# Patient Record
Sex: Female | Born: 2000 | Race: White | Hispanic: No | Marital: Single | Smoking: Never smoker
Health system: Southern US, Community
[De-identification: ages and names within clinical notes are randomized; demographics above are authoritative.]

## PROBLEM LIST (undated history)

## (undated) DIAGNOSIS — F329 Major depressive disorder, single episode, unspecified: Secondary | ICD-10-CM

## (undated) DIAGNOSIS — E282 Polycystic ovarian syndrome: Secondary | ICD-10-CM

## (undated) DIAGNOSIS — F419 Anxiety disorder, unspecified: Secondary | ICD-10-CM

## (undated) DIAGNOSIS — F32A Depression, unspecified: Secondary | ICD-10-CM

## (undated) DIAGNOSIS — T7840XA Allergy, unspecified, initial encounter: Secondary | ICD-10-CM

---
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
## 2021-01-10 IMAGING — CT CT NECK W/ CM
4 of 5 series · 14 of 33 positions shown, 16 images · IV contrast (Omni 300)
Comparison: None.

CLINICAL DATA: Chronic neck pain with difficulty swallowing

EXAM:
CT NECK WITH CONTRAST
TECHNIQUE: Multidetector CT imaging of the neck was performed using the
standard protocol following the bolus administration of intravenous
contrast.
CONTRAST:  75mL OMNIPAQUE IOHEXOL 300 MG/ML  SOLN

[Series 3: neck 2.0 st · axial · 0.43mm/px · z∈[-313,-185]mm · 3 of 128 slices shown, 4 images]
[im 32/128  soft-tissue]
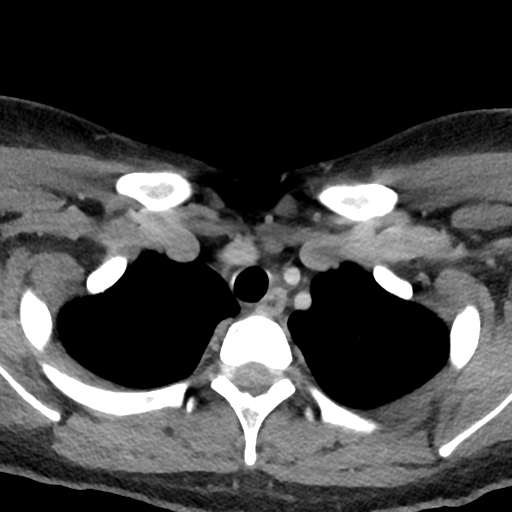
[im 32/128  bone]
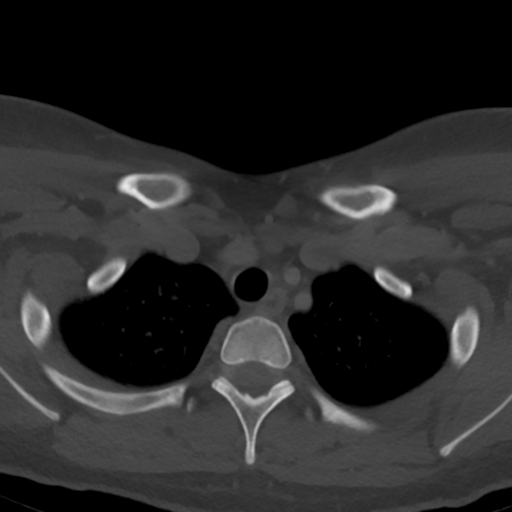
[im 64/128  bone]
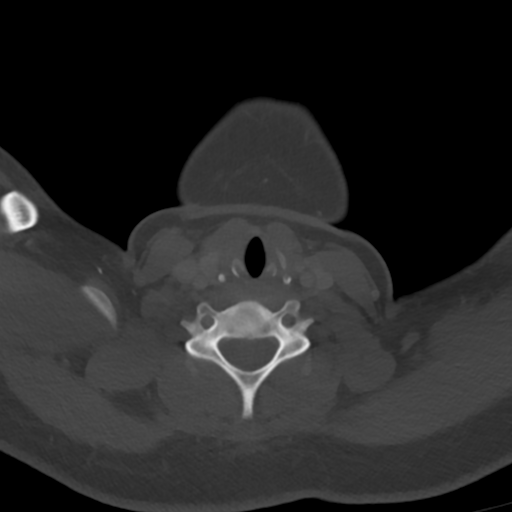
[im 96/128  bone]
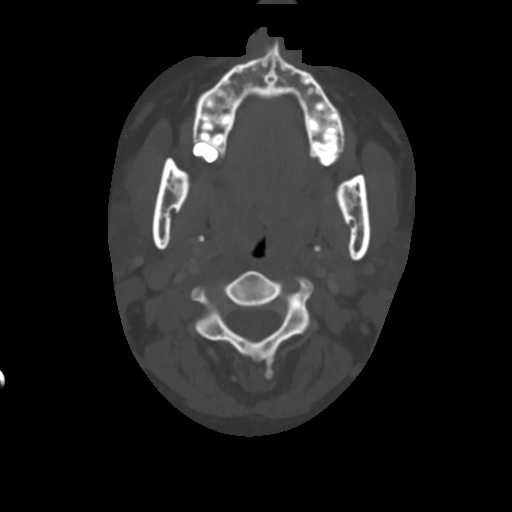

[Series 6: sagittal · sagittal · 0.41mm/px · 5 of 101 slices shown, 6 images]
[im 34/101  bone]
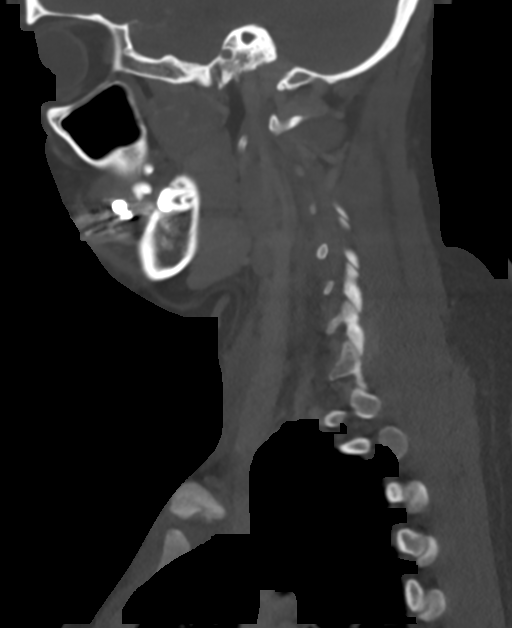
[im 42/101  bone]
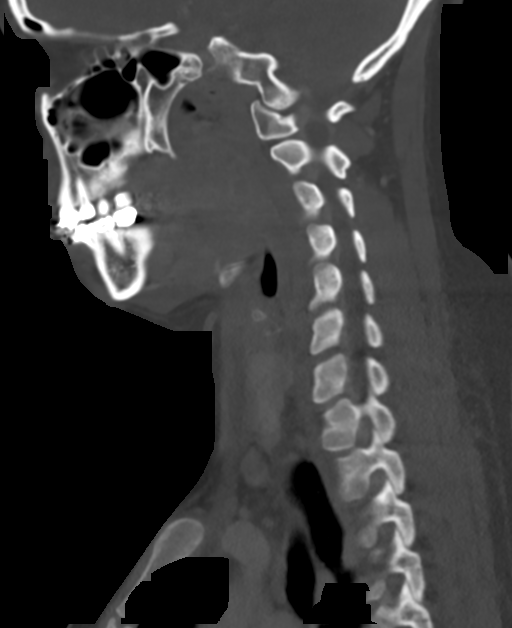
[im 51/101  soft-tissue]
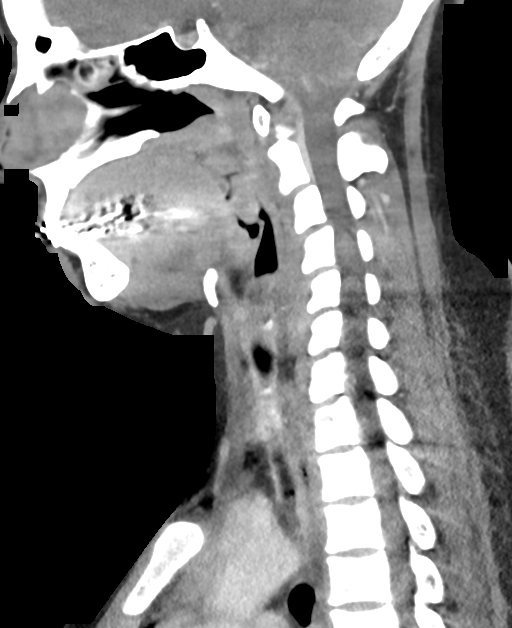
[im 51/101  bone]
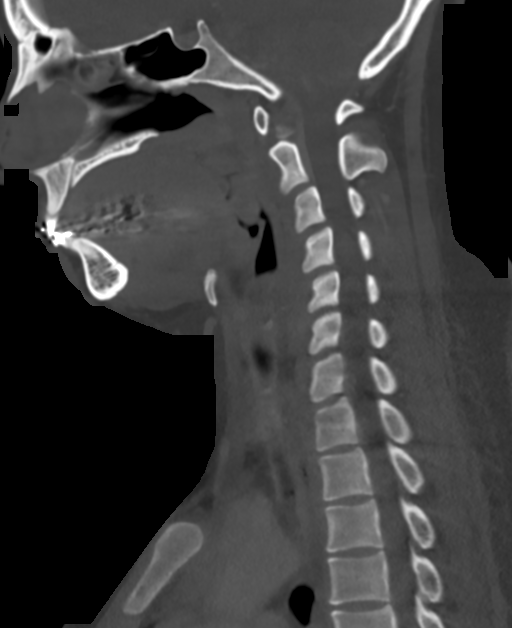
[im 59/101  bone]
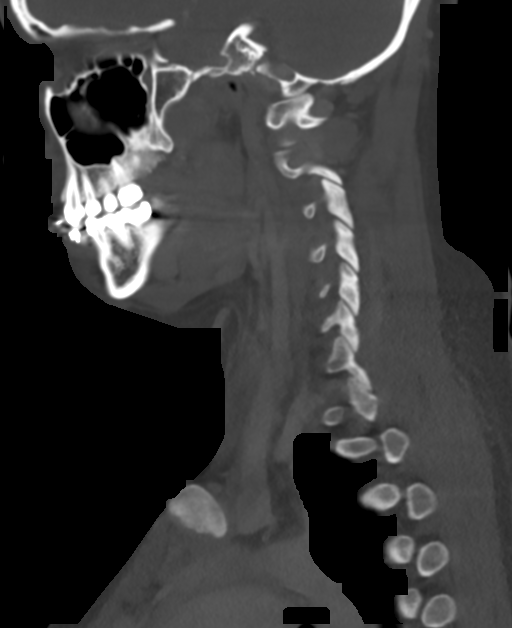
[im 67/101  bone]
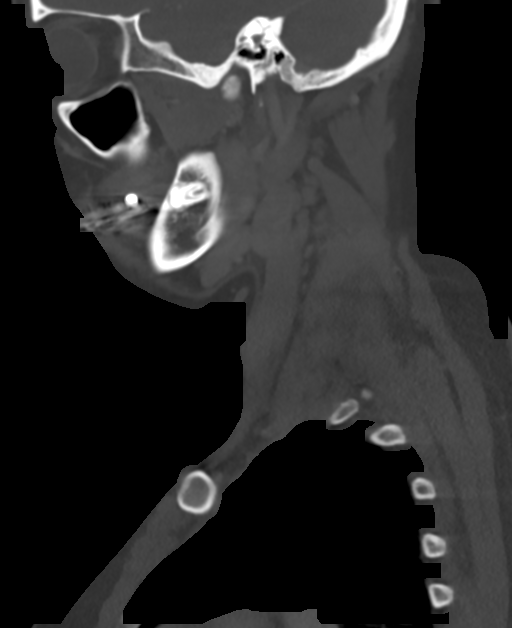

[Series 7: coronal · coronal · 0.37mm/px · 3 of 115 slices shown]
[im 23/115  bone]
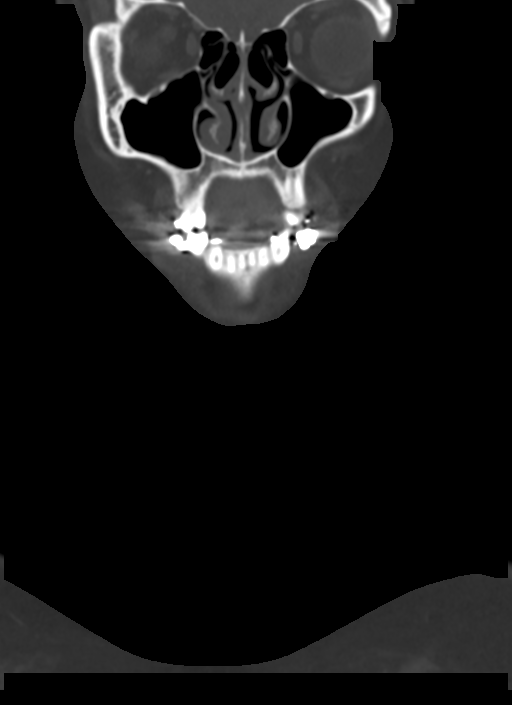
[im 46/115  bone]
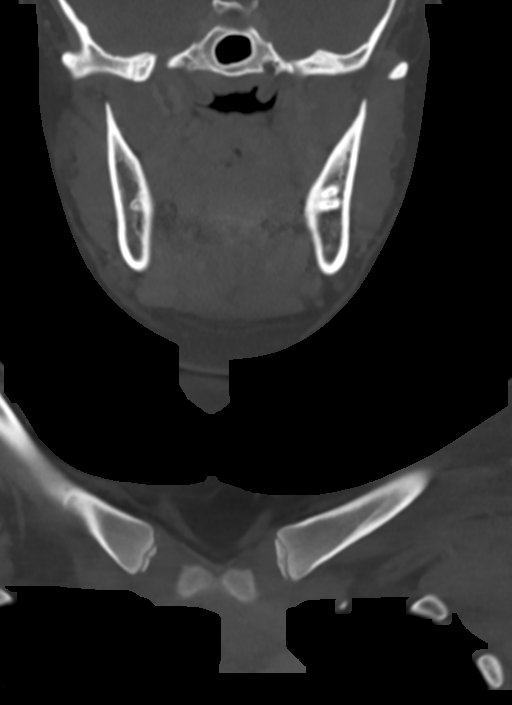
[im 69/115  bone]
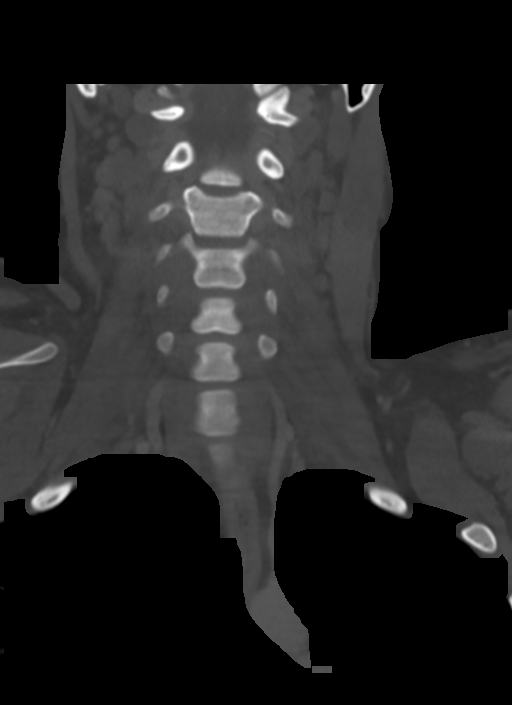

[Series 8: orthogonal · axial · 0.33mm/px · z∈[-311,-187]mm · 3 of 126 slices shown]
[im 32/126  bone]
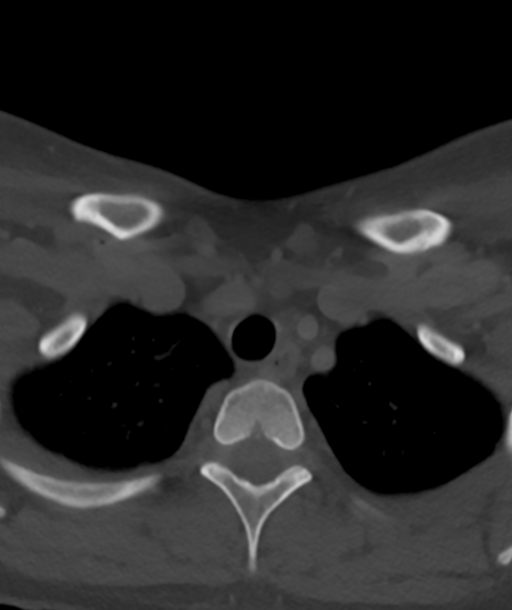
[im 63/126  bone]
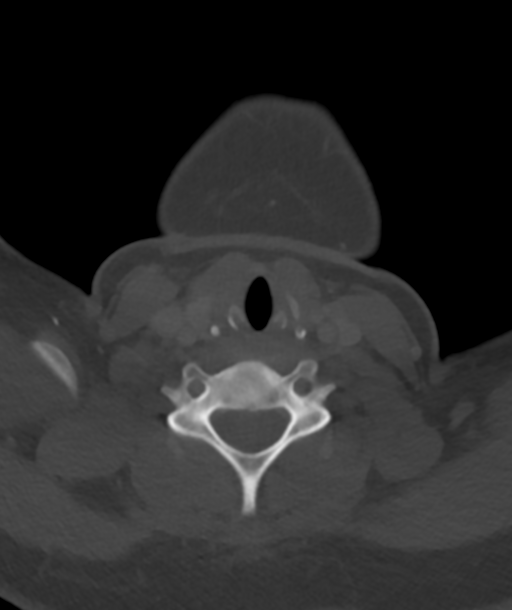
[im 94/126  bone]
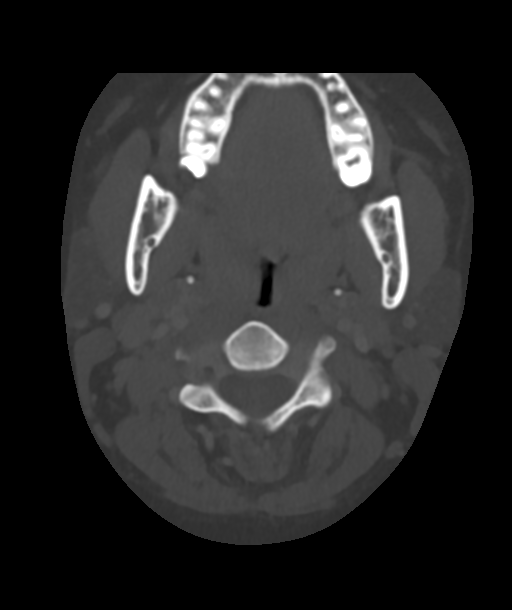

[14 of 33 positions shown; findings below may reference images not displayed]

FINDINGS: PHARYNX AND LARYNX: Palatine tonsils are markedly enlarged. No
peritonsillar fluid collection. No retropharyngeal abnormality.
Epiglottis is normal. There is mild enlargement of the adenoid and
lingual tonsils.

SALIVARY GLANDS: Normal parotid, submandibular and sublingual
glands.

THYROID: Normal.

LYMPH NODES: Bilateral enlarged upper cervical lymph nodes measuring
up to 14 mm.

VASCULAR: Major cervical vessels are patent.

LIMITED INTRACRANIAL: Normal.

VISUALIZED ORBITS: Normal.

MASTOIDS AND VISUALIZED PARANASAL SINUSES: No fluid levels or
advanced mucosal thickening. No mastoid effusion.

SKELETON: No bony spinal canal stenosis. No lytic or blastic
lesions.

UPPER CHEST: Clear.

OTHER: None.
IMPRESSION: 1. Markedly enlarged palatine tonsils and mild enlargement of the
adenoid and lingual tonsils, consistent with acute
tonsillopharyngitis. No peritonsillar fluid collection or abscess.
2. Bilateral enlarged upper cervical lymph nodes, likely reactive.

## 2022-05-19 ENCOUNTER — Emergency Department (HOSPITAL_BASED_OUTPATIENT_CLINIC_OR_DEPARTMENT_OTHER)
Admission: EM | Admit: 2022-05-19 | Discharge: 2022-05-20 | Disposition: A | Discharge status: Home / Self Care | Payer: Medicaid Other | Attending: Emergency Medicine | Admitting: Emergency Medicine

## 2022-05-19 ENCOUNTER — Encounter (HOSPITAL_BASED_OUTPATIENT_CLINIC_OR_DEPARTMENT_OTHER): Payer: Self-pay | Admitting: Urology

## 2022-05-19 DIAGNOSIS — Z7951 Long term (current) use of inhaled steroids: Secondary | ICD-10-CM | POA: Insufficient documentation

## 2022-05-19 DIAGNOSIS — G4489 Other headache syndrome: Secondary | ICD-10-CM | POA: Insufficient documentation

## 2022-05-19 DIAGNOSIS — J45909 Unspecified asthma, uncomplicated: Secondary | ICD-10-CM | POA: Diagnosis not present

## 2022-05-19 DIAGNOSIS — R519 Headache, unspecified: Secondary | ICD-10-CM | POA: Diagnosis present

## 2022-05-19 HISTORY — DX: Polycystic ovarian syndrome: E28.2

## 2022-05-19 MED ORDER — ONDANSETRON 4 MG PO TBDP
8.0000 mg | ORAL_TABLET | Freq: Once | ORAL | Status: AC
  Administered 2022-05-19: 8 mg via ORAL
  Filled 2022-05-19: qty 2

## 2022-05-19 MED ORDER — IBUPROFEN 400 MG PO TABS
400.0000 mg | ORAL_TABLET | Freq: Once | ORAL | Status: AC
  Administered 2022-05-19: 400 mg via ORAL
  Filled 2022-05-19: qty 1
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
## 2022-05-19 NOTE — ED Provider Notes (Signed)
[facility] EMERGENCY DEPARTMENT Provider Note   CSN: [id number] Arrival date & time: 05/19/22  2258     History {Add pertinent medical, surgical, social history, OB history to HPI:1} Chief Complaint  Patient presents with   Headache    Molly Mays is a 21 y.o. female.  The history is provided by the patient and a parent.  Headache Pain location:  Frontal Onset quality:  Gradual Timing:  Constant Progression:  Worsening Chronicity:  New Similar to prior headaches: yes   Worsened by:  Light Associated symptoms: nausea and photophobia   Associated symptoms: no blurred vision, no fever, no focal weakness and no weakness   Patient with history of PCOS, depression and migraines presents with headache.  She reports around 3 PM today she was holding her phone when her brother threw a stuffed animal which hit the phone which hit her forehead. No LOC or vomiting.  The headache started soon after.  She reports she now feels like she has a migraine.  No visual changes or focal weakness. She went to work at [employer] which exacerbated her headache It is  unrelieved by Tylenol     Past Medical History:  Diagnosis Date   Allergy    Anxiety    Asthma    Depression    PCOS (polycystic ovarian syndrome)     Home Medications Prior to Admission medications   Medication Sig Start Date End Date Taking? Authorizing Provider  albuterol (PROVENTIL HFA;VENTOLIN HFA) 108 (90 BASE) MCG/ACT inhaler Inhale 2 puffs into the lungs every 6 (six) hours as needed for wheezing or shortness of breath.     [provider]  albuterol (PROVENTIL) (5 MG/ML) 0.5% nebulizer solution Take 0.5 mLs (2.5 mg total) by nebulization every 6 (six) hours as needed for wheezing or shortness of breath. 01/15/20   Reichert, Wyvonnia Dusky, MD  beclomethasone (QVAR) 40 MCG/ACT inhaler Inhale 1 puff into the lungs 2 (two) times daily.    [provider]  cetirizine (ZYRTEC) 10 MG tablet Take 10 mg by  mouth daily as needed for allergies.    [provider]  Desvenlafaxine Succinate ER 25 MG TB24 Take 2 tablets by mouth daily. 08/15/20   [provider]  EPINEPHrine (EPI-PEN) 0.3 mg/0.3 mL DEVI Inject 0.3 mg into the muscle once as needed (severe allergic reaction).     [provider]  ferrous sulfate 325 (65 FE) MG tablet Take 325 mg by mouth 2 (two) times daily.    [provider]  FLOVENT HFA 110 MCG/ACT inhaler Inhale 2 puffs into the lungs in the morning and at bedtime. 07/06/20   [provider]  glucose blood test strip 1 each by Other route as needed for other. Use as instructed    [provider]  rizatriptan (MAXALT) 10 MG tablet Take 1 tablet by mouth daily as needed. 08/18/20   [provider]  Vitamin D, Ergocalciferol, (DRISDOL) 1.25 MG (50000 UNIT) CAPS capsule Take 50,000 Units by mouth once a week. 08/27/20   [provider]  sertraline (ZOLOFT) 100 MG tablet Take 1 tablet (100 mg total) by mouth at bedtime. 02/23/18 09/10/19  Maryagnes Amos, FNP      Allergies    Chicken allergy, Eggs or egg-derived products, and Fish allergy    Review of Systems   Review of Systems  Constitutional:  Negative for fever.  Eyes:  Positive for photophobia. Negative for blurred vision.  Gastrointestinal:  Positive for nausea.  Neurological:  Positive for headaches. Negative for focal weakness and weakness.    Physical Exam Updated Vital Signs BP 134/68 (BP Location: Right Arm)   Pulse 85   Temp (!) 97.5 F (36.4 C) (Oral)   Resp 18   Ht 1.676 m (5\' 6" )   Wt 95.3 kg   LMP 05/19/2022 (Exact Date)   SpO2 98%   BMI 33.89 kg/m  Physical Exam CONSTITUTIONAL: Well developed/well nourished HEAD: Normocephalic/atraumatic EYES: EOMI/PERRL, no nystagmus, no ptosis, normal fundoscopic exam (no papilledema)  ENMT: Mucous membranes moist NECK: supple no meningeal signs SPINE/BACK:entire spine nontender CV: S1/S2  noted, no murmurs/rubs/gallops noted LUNGS: Lungs are clear to auscultation bilaterally, no apparent distress ABDOMEN: soft, nontender, no rebound or guarding GU:no cva tenderness NEURO:Awake/alert, face symmetric, no arm or leg drift is noted Equal 5/5 strength with shoulder abduction, elbow flex/extension, wrist flex/extension in upper extremities and equal hand grips bilaterally Equal 5/5 strength with hip flexion,knee flex/extension, foot dorsi/plantar flexion Cranial nerves 3/4/5/6/05/13/09/11/12 tested and intact Gait normal without ataxia No past pointing Sensation to light touch intact in all extremities EXTREMITIES: pulses normal, full ROM SKIN: warm, color normal PSYCH: no abnormalities of mood noted, alert and oriented to situation  ED Results / Procedures / Treatments   Labs (all labs ordered are listed, but only abnormal results are displayed) Labs Reviewed - No data to display  EKG None  Radiology No results found.  Procedures Procedures  {Document cardiac monitor, telemetry assessment procedure when appropriate:1}  Medications Ordered in ED Medications  ondansetron (ZOFRAN-ODT) disintegrating tablet 8 mg (has no administration in time range)  ibuprofen (ADVIL) tablet 400 mg (has no administration in time range)    ED Course/ Medical Decision Making/ A&P                           Medical Decision Making Risk Prescription drug management.   This patient presents to the ED for concern of headache, this involves an extensive number of treatment options, and is a complaint that carries with it a high risk of complications and morbidity.  The differential diagnosis includes but is not limited to subarachnoid hemorrhage, intracranial hemorrhage, meningitis, encephalitis, CVST, idiopathic intracranial hypertension, migraine    Comorbidities that complicate the patient evaluation: Patient's presentation is complicated by their history of depression and  migraine  Additional history obtained: Additional history obtained from family Records reviewed Care Everywhere/External Records  Lab Tests: I Ordered, and personally interpreted labs.  The pertinent results include:  ***  Imaging Studies ordered: I ordered imaging studies including {imaging:26848}  I independently visualized and interpreted imaging which showed *** I agree with the radiologist interpretation  Cardiac Monitoring: The patient was maintained on a cardiac monitor.  I personally viewed and interpreted the cardiac monitor which showed an underlying rhythm of:  {cardiac monitor:26849}  Medicines ordered and prescription drug management: I ordered medication including ***  for ***  Reevaluation of the patient after these medicines showed that the patient    {resolved/improved/worsened:23923::"improved"}  Test Considered: Patient is low risk / negative by ***, therefore do not feel that *** is indicated.  Critical Interventions:  ***  Consultations Obtained: I requested consultation with the {consultation:26851}, and discussed  findings as well as pertinent plan - they recommend: ***  Reevaluation: After the interventions noted above, I reevaluated the patient and found that they have :{resolved/improved/worsened:23923::"improved"}  Complexity of problems addressed: Patient's presentation is most consistent with  07/14/09  Disposition:  After consideration of the diagnostic results and the patient's response to treatment,  I feel that the patent would benefit from {disposition:26850}.     {Document critical care time when appropriate:1} {Document review of labs and clinical decision tools ie heart score, Chads2Vasc2 etc:1}  {Document your independent review of radiology images, and any outside records:1} {Document your discussion with family members, caretakers, and with consultants:1} {Document social determinants of health affecting pt's care:1} {Document  your decision making why or why not admission, treatments were needed:1} Final Clinical Impression(s) / ED Diagnoses Final diagnoses:  None    Rx / DC Orders ED Discharge Orders     None

## 2022-05-20 MED ORDER — ONDANSETRON 4 MG PO TBDP: 4.0000 mg | ORAL_TABLET | Freq: Three times a day (TID) | ORAL | 0 refills | Status: AC | PRN

## 2022-05-20 NOTE — Discharge Instructions (Signed)

## 2022-05-20 NOTE — ED Notes (Signed)
Patient verbalizes understanding of discharge instructions. Opportunity for questioning and answers were provided. Armband removed by staff, pt discharged from ED. Ambulated out to lobby with mom  

## 2022-05-20 NOTE — ED Notes (Signed)
Pt reports no relief of HA pain

## 2023-02-28 ENCOUNTER — Emergency Department (HOSPITAL_BASED_OUTPATIENT_CLINIC_OR_DEPARTMENT_OTHER)
Admission: EM | Admit: 2023-02-28 | Discharge: 2023-02-28 | Disposition: A | Discharge status: Home / Self Care | Payer: Medicaid Other | Attending: Emergency Medicine | Admitting: Emergency Medicine

## 2023-02-28 ENCOUNTER — Other Ambulatory Visit: Payer: Self-pay

## 2023-02-28 ENCOUNTER — Emergency Department (HOSPITAL_BASED_OUTPATIENT_CLINIC_OR_DEPARTMENT_OTHER): Payer: Medicaid Other

## 2023-02-28 DIAGNOSIS — R519 Headache, unspecified: Secondary | ICD-10-CM | POA: Diagnosis not present

## 2023-02-28 DIAGNOSIS — J45909 Unspecified asthma, uncomplicated: Secondary | ICD-10-CM | POA: Insufficient documentation

## 2023-02-28 MED ORDER — ONDANSETRON 4 MG PO TBDP
4.0000 mg | ORAL_TABLET | Freq: Once | ORAL | Status: AC
  Administered 2023-02-28: 4 mg via ORAL
  Filled 2023-02-28: qty 1

## 2023-02-28 MED ORDER — ACETAMINOPHEN 500 MG PO TABS
1000.0000 mg | ORAL_TABLET | Freq: Once | ORAL | Status: AC
  Administered 2023-02-28: 1000 mg via ORAL
  Filled 2023-02-28: qty 2

## 2023-02-28 MED ORDER — ONDANSETRON HCL 4 MG PO TABS: 4.0000 mg | ORAL_TABLET | Freq: Four times a day (QID) | ORAL | 0 refills | Status: AC

## 2023-02-28 NOTE — Discharge Instructions (Addendum)
Please call the neurologist I have attached your for you in regards to your frequent headaches.  Today your CT scan was negative and as we discussed we agreed labs would not be necessary today as your symptoms improved with Tylenol and Zofran.  I will prescribe you Zofran for your nausea but you may alternate between Tylenol and ibuprofen every 6 hours as needed for pain.  Please monitor your headaches and if you begin to have worsening symptoms please return to ER.

## 2023-02-28 NOTE — ED Triage Notes (Addendum)
Complains of HA, neck, nausea. Starting in the AM. Worsening as the day progressed. Denies visual changes- only sensitivity to light. No focal deficits- generalized fatigue. Attributes to migraine- HX same. Home meds not helping.

## 2023-02-28 NOTE — ED Provider Notes (Signed)
[facility] EMERGENCY DEPARTMENT AT [facility] Provider Note   CSN: [id number] Arrival date & time: 02/28/23  1817     History  Chief Complaint  Patient presents with   Migraine    Molly Mays is a 22 y.o. female history of headaches, asthma presented with a headache that began this morning when she woke up.  Patient states that she tried 1 dose of Tylenol at home so endorses symptoms.  Patient states that the headache is a bandlike headache around her head that goes into her neck muscles.  Patient states she is able to range her neck fully and does not endorse any true neck pain or neck rigidity.  Patient states she is nauseous but no episodes of emesis.  Patient denies photophobia or recent head trauma or new onset weakness.  Patient states his headache is similar to previous headaches and denies the headache being sudden or maximal in onset.  Patient denies blood thinners, chest pain, shortness of breath, Donnell pain, vision changes, fevers, chills, altered mental status  Home Medications Prior to Admission medications   Medication Sig Start Date End Date Taking? Authorizing Provider  ondansetron (ZOFRAN) 4 MG tablet Take 1 tablet (4 mg total) by mouth every 6 (six) hours. 02/28/23  Yes Casee Knepp, Beverly Gust, PA-C  albuterol (PROVENTIL HFA;VENTOLIN HFA) 108 (90 BASE) MCG/ACT inhaler Inhale 2 puffs into the lungs every 6 (six) hours as needed for wheezing or shortness of breath.     [provider]  albuterol (PROVENTIL) (5 MG/ML) 0.5% nebulizer solution Take 0.5 mLs (2.5 mg total) by nebulization every 6 (six) hours as needed for wheezing or shortness of breath. 01/15/20   Reichert, Wyvonnia Dusky, MD  beclomethasone (QVAR) 40 MCG/ACT inhaler Inhale 1 puff into the lungs 2 (two) times daily.    [provider]  cetirizine (ZYRTEC) 10 MG tablet Take 10 mg by mouth daily as needed for allergies.    [provider]  Desvenlafaxine Succinate ER 25 MG TB24 Take 2  tablets by mouth daily. 08/15/20   [provider]  EPINEPHrine (EPI-PEN) 0.3 mg/0.3 mL DEVI Inject 0.3 mg into the muscle once as needed (severe allergic reaction).     [provider]  ferrous sulfate 325 (65 FE) MG tablet Take 325 mg by mouth 2 (two) times daily.    [provider]  FLOVENT HFA 110 MCG/ACT inhaler Inhale 2 puffs into the lungs in the morning and at bedtime. 07/06/20   [provider]  glucose blood test strip 1 each by Other route as needed for other. Use as instructed    [provider]  ondansetron (ZOFRAN-ODT) 4 MG disintegrating tablet Take 1 tablet (4 mg total) by mouth every 8 (eight) hours as needed. 05/20/22   Zadie Rhine, MD  rizatriptan (MAXALT) 10 MG tablet Take 1 tablet by mouth daily as needed. 08/18/20   [provider]  Vitamin D, Ergocalciferol, (DRISDOL) 1.25 MG (50000 UNIT) CAPS capsule Take 50,000 Units by mouth once a week. 08/27/20   [provider]  sertraline (ZOLOFT) 100 MG tablet Take 1 tablet (100 mg total) by mouth at bedtime. 02/23/18 09/10/19  Maryagnes Amos, FNP      Allergies    Chicken allergy, Egg-derived products, and Fish allergy    Review of Systems   Review of Systems Headache Physical Exam Updated Vital Signs BP (!) 127/47   Pulse 71   Temp 97.8 F (36.6 C)   Resp 18  LMP 02/24/2023 (Approximate)   SpO2 96%  Physical Exam Constitutional:      General: She is not in acute distress. Eyes:     Extraocular Movements: Extraocular movements intact.     Conjunctiva/sclera: Conjunctivae normal.     Pupils: Pupils are equal, round, and reactive to light.     Comments: No photophobia  Neck:     Comments: Tender to palpation in paracervical muscles No midline tenderness or step-off/crepitus/abnormalities palpated Cardiovascular:     Rate and Rhythm: Normal rate and regular rhythm.     Pulses: Normal pulses.     Heart sounds: Normal heart sounds.  Pulmonary:      Effort: Pulmonary effort is normal. No respiratory distress.     Breath sounds: Normal breath sounds.  Abdominal:     Palpations: Abdomen is soft.     Tenderness: There is no abdominal tenderness. There is no guarding or rebound.  Musculoskeletal:     Cervical back: Normal range of motion and neck supple. No rigidity.  Skin:    General: Skin is warm and dry.     Capillary Refill: Capillary refill takes less than 2 seconds.  Neurological:     Mental Status: She is alert.     Comments: Sensation intact in all 4 limbs     ED Results / Procedures / Treatments   Labs (all labs ordered are listed, but only abnormal results are displayed) Labs Reviewed - No data to display  EKG None  Radiology CT Head Wo Contrast  Result Date: 02/28/2023 CLINICAL DATA:  Headache EXAM: CT HEAD WITHOUT CONTRAST TECHNIQUE: Contiguous axial images were obtained from the base of the skull through the vertex without intravenous contrast. RADIATION DOSE REDUCTION: This exam was performed according to the departmental dose-optimization program which includes automated exposure control, adjustment of the mA and/or kV according to patient size and/or use of iterative reconstruction technique. COMPARISON:  CT head 09/03/20 FINDINGS: Brain: No evidence of acute infarction, hemorrhage, hydrocephalus, extra-axial collection or mass lesion/mass effect. Vascular: No hyperdense vessel or unexpected calcification. Skull: Normal. Negative for fracture or focal lesion. Sinuses/Orbits: No middle ear or mastoid effusion. Mucosal thickening bilateral ethmoid sinuses. Orbits are unremarkable. Other: None. IMPRESSION: No acute intracranial abnormality. No specific etiology for headaches identified. Electronically Signed   By: Lorenza Cambridge M.D.   On: 02/28/2023 20:14    Procedures Procedures    Medications Ordered in ED Medications  acetaminophen (TYLENOL) tablet 1,000 mg (1,000 mg Oral Given 02/28/23 1856)  ondansetron  (ZOFRAN-ODT) disintegrating tablet 4 mg (4 mg Oral Given 02/28/23 1856)    ED Course/ Medical Decision Making/ A&P                             Medical Decision Making Amount and/or Complexity of Data Reviewed Radiology: ordered.  Risk OTC drugs. Prescription drug management.   Lance Sell 22 y.o. presented today for headache. Working DDx that I considered at this time includes, but not limited to, tension headache, migraine intracranial mass, intracranial hemorrhage, intracranial infection including meningitis vs encephalitis, GCA, trigeminal neuralgia.  R/o DDx: Migraine: These are considered less likely due to history of present illness and physical exam findings SAH/ICH: Timeline and slow onset is not consistent with SAH/ICH  GCA: Age and description of pain is not consistent with GCA  Meningitis/encephalitis: Lack of fever,meningismus is not consistent Intracranial Mass:  less likely due to history of present illness and physical exam findings  Trigeminal Neuralgia: headache does not meet this description Intracranial Infection:  less likely due to history of present illness and physical exam findings, no fevers  Review of prior external notes: 05/19/2022 ED  Unique Tests and My Interpretation:  CT Head w/o Contrast: No acute intracranial abnormalities  Discussion with Independent Historian:  Family member  Discussion of Management of Tests: None  Risk: Medium: prescription drug management  Risk Stratification Score: None  Plan: Patient presented for HA. On exam patient was in no acute distress and stable vitals. Physical exam was markable for tenderness in the paracervical muscles but otherwise was largely unremarkable.  Patient described the headache as a band around her head that was bilateral without vision changes.  Patient did not endorse any red flag symptoms and at this time I suspect patient most likely has a tension headache.  I spoke to the patient and the  family member about starting with Tylenol and Zofran for symptom management and with holding CT scan and labs to see if patient responds well to the initial treatment.  Patient and family member verbalized agreement to this plan. Patient will be given Tylenol and Zofran for HA treatment. Patient stable at this time.  On recheck patient stated the Tylenol and Zofran had helped her headache significantly but still endorsed a mild headache.  Also the patient patient stated that she gets headaches 5 to 6 days out of the week over the past month that are similar in nature to today's headache.  I spoke to the patient and with shared decision making we decided a CT scan of her head due to the frequency of her headaches was not unreasonable.  Pending CT scan patient will be discharged with neurology follow-up due to her frequent headaches.  Patient CT scan was negative.  On recheck patient continued to improve and I suspect patient most likely has a tension headache given the description of the headache and improvement on supportive measurements.  Patient be given neurology follow-up due to frequent nature of her headaches and educated that she may alternate Tylenol and ibuprofen every 6 hours as needed for pain.  Patient was given red flag symptoms to monitor and to return to ER if she begins experiencing that.  Patient was given return precautions. Patient stable for discharge at this time.  Patient verbalized understanding of plan.         Final Clinical Impression(s) / ED Diagnoses Final diagnoses:  Nonintractable headache, unspecified chronicity pattern, unspecified headache type    Rx / DC Orders ED Discharge Orders          Ordered    ondansetron (ZOFRAN) 4 MG tablet  Every 6 hours        02/28/23 2056              Remi Deter 02/28/23 2116    Rolan Bucco, MD 02/28/23 2220

## 2024-03-08 ENCOUNTER — Encounter (HOSPITAL_BASED_OUTPATIENT_CLINIC_OR_DEPARTMENT_OTHER): Payer: Self-pay | Admitting: Emergency Medicine

## 2024-03-08 ENCOUNTER — Emergency Department (HOSPITAL_BASED_OUTPATIENT_CLINIC_OR_DEPARTMENT_OTHER)
Admission: EM | Admit: 2024-03-08 | Discharge: 2024-03-08 | Disposition: A | Discharge status: Home / Self Care | Attending: Emergency Medicine | Admitting: Emergency Medicine

## 2024-03-08 DIAGNOSIS — J45909 Unspecified asthma, uncomplicated: Secondary | ICD-10-CM | POA: Diagnosis not present

## 2024-03-08 DIAGNOSIS — T7840XA Allergy, unspecified, initial encounter: Secondary | ICD-10-CM | POA: Diagnosis present

## 2024-03-08 MED ORDER — IPRATROPIUM-ALBUTEROL 0.5-2.5 (3) MG/3ML IN SOLN: 3.0000 mL | Freq: Once | RESPIRATORY_TRACT | Status: AC

## 2024-03-08 MED ORDER — FAMOTIDINE IN NACL 20-0.9 MG/50ML-% IV SOLN
20.0000 mg | Freq: Once | INTRAVENOUS | Status: AC
  Administered 2024-03-08: 20 mg via INTRAVENOUS
  Filled 2024-03-08: qty 50

## 2024-03-08 MED ORDER — EPINEPHRINE 0.3 MG/0.3ML IJ SOAJ
0.3000 mg | Freq: Once | INTRAMUSCULAR | Status: AC
  Filled 2024-03-08: qty 0.3

## 2024-03-08 MED ORDER — EPINEPHRINE 0.3 MG/0.3ML IJ SOAJ: 0.3000 mg | INTRAMUSCULAR | 0 refills | Status: AC | PRN

## 2024-03-08 MED ORDER — EPINEPHRINE 0.3 MG/0.3ML IJ SOAJ
INTRAMUSCULAR | Status: AC
  Administered 2024-03-08: 0.3 mg via INTRAMUSCULAR
  Filled 2024-03-08: qty 0.3

## 2024-03-08 MED ORDER — PREDNISONE 20 MG PO TABS: 40.0000 mg | ORAL_TABLET | Freq: Every day | ORAL | 0 refills | Status: DC

## 2024-03-08 MED ORDER — IPRATROPIUM-ALBUTEROL 0.5-2.5 (3) MG/3ML IN SOLN
RESPIRATORY_TRACT | Status: AC
  Administered 2024-03-08: 3 mL via RESPIRATORY_TRACT
  Filled 2024-03-08: qty 3

## 2024-03-08 MED ORDER — RACEPINEPHRINE HCL 2.25 % IN NEBU
INHALATION_SOLUTION | RESPIRATORY_TRACT | Status: AC
  Administered 2024-03-08: 0.5 mL
  Filled 2024-03-08: qty 0.5

## 2024-03-08 MED ORDER — METHYLPREDNISOLONE SODIUM SUCC 125 MG IJ SOLR
125.0000 mg | Freq: Once | INTRAMUSCULAR | Status: AC
  Administered 2024-03-08: 125 mg via INTRAVENOUS
  Filled 2024-03-08: qty 2

## 2024-03-08 NOTE — ED Provider Notes (Signed)
 [place] [facility] AT [facility] Provider Note   CSN: [id number] Arrival date & time: 03/08/24  1342     History  Chief Complaint  Patient presents with   Allergic Reaction    Molly Mays is a 23 y.o. female.   Allergic Reaction   23 year old female presents emergency department with concerns for allergic reaction.  Patient reports eating a brownie with Oreo and at after working out at the gym.  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  Began to have feelings of chest tightness, throat itchiness and feelings as if her throat was tight.  States that she has had anaphylactic reaction before with use of EpiPen  in the past.  States that her EpiPen 's at home are out of date she did not take them.  Does state that she took 325 mg tablets of Benadryl prior to arrival.  Denies any rash, abdominal pain, nausea, vomiting.  States that her symptoms are not improved after the Benadryl.  Past medical history significant for asthma, allergy, PCOS, MDD  Home Medications Prior to Admission medications   Medication Sig Start Date End Date Taking? Authorizing Provider  EPINEPHrine  0.3 mg/0.3 mL IJ SOAJ injection Inject 0.3 mg into the muscle as needed for anaphylaxis. 03/08/24  Yes Neil Balls A, PA  predniSONE  (DELTASONE ) 20 MG tablet Take 2 tablets (40 mg total) by mouth daily with breakfast for 4 days. 03/09/24 03/13/24 Yes Neil Balls A, PA  albuterol  (PROVENTIL  HFA;VENTOLIN  HFA) 108 (90 BASE) MCG/ACT inhaler Inhale 2 puffs into the lungs every 6 (six) hours as needed for wheezing or shortness of breath.     [provider]  albuterol  (PROVENTIL ) (5 MG/ML) 0.5% nebulizer solution Take 0.5 mLs (2.5 mg total) by nebulization every 6 (six) hours as needed for wheezing or shortness of breath. 01/15/20   Reichert, Janyth Meres, MD  beclomethasone (QVAR ) 40 MCG/ACT inhaler Inhale 1 puff into the lungs 2 (two) times daily.    [provider]   cetirizine (ZYRTEC) 10 MG tablet Take 10 mg by mouth daily as needed for allergies.    [provider]  Desvenlafaxine Succinate ER 25 MG TB24 Take 2 tablets by mouth daily. 08/15/20   [provider]  EPINEPHrine  (EPI-PEN) 0.3 mg/0.3 mL DEVI Inject 0.3 mg into the muscle once as needed (severe allergic reaction).     [provider]  ferrous sulfate  325 (65 FE) MG tablet Take 325 mg by mouth 2 (two) times daily.    [provider]  FLOVENT HFA 110 MCG/ACT inhaler Inhale 2 puffs into the lungs in the morning and at bedtime. 07/06/20   [provider]  glucose blood test strip 1 each by Other route as needed for other. Use as instructed    [provider]  ondansetron  (ZOFRAN ) 4 MG tablet Take 1 tablet (4 mg total) by mouth every 6 (six) hours. 02/28/23   Denese Finn, PA-C  ondansetron  (ZOFRAN -ODT) 4 MG disintegrating tablet Take 1 tablet (4 mg total) by mouth every 8 (eight) hours as needed. 05/20/22   Eldon Greenland, MD  rizatriptan (MAXALT) 10 MG tablet Take 1 tablet by mouth daily as needed. 08/18/20   [provider]  Vitamin D, Ergocalciferol, (DRISDOL) 1.25 MG (50000 UNIT) CAPS capsule Take 50,000 Units by mouth once a week. 08/27/20   [provider]  sertraline  (ZOLOFT ) 100 MG tablet Take 1 tablet (100 mg total) by mouth at bedtime. 02/23/18 09/10/19  Rella Cardinal, FNP      Allergies    Egg-derived products, Chicken allergy, and Fish allergy    Review of Systems   Review of Systems  All other systems reviewed and are negative.   Physical Exam Updated Vital Signs BP 94/72   Pulse 75   Temp 98.6 F (37 C) (Oral)   Resp 11   Ht 5\' 8"  (1.727 m)   Wt 97.5 kg   LMP 01/24/2024   SpO2 98%   BMI 32.69 kg/m  Physical Exam Vitals and nursing note reviewed.  Constitutional:      General: She is not in acute distress.    Appearance: She is well-developed.  HENT:     Head: Normocephalic and  atraumatic.  Eyes:     Conjunctiva/sclera: Conjunctivae normal.  Cardiovascular:     Rate and Rhythm: Normal rate and regular rhythm.     Heart sounds: No murmur heard. Pulmonary:     Effort: Pulmonary effort is normal. No respiratory distress.     Breath sounds: Wheezing present. No rhonchi or rales.     Comments: Faint auscultatory stridor Abdominal:     Palpations: Abdomen is soft.     Tenderness: There is no abdominal tenderness. There is no guarding.  Musculoskeletal:        General: No swelling.     Cervical back: Neck supple.  Skin:    General: Skin is warm and dry.     Capillary Refill: Capillary refill takes less than 2 seconds.  Neurological:     Mental Status: She is alert.  Psychiatric:        Mood and Affect: Mood normal.     ED Results / Procedures / Treatments   Labs (all labs ordered are listed, but only abnormal results are displayed) Labs Reviewed - No data to display  EKG None  Radiology No results found.  Procedures .Critical Care  Performed by: Opal Butter, PA Authorized by: [place] Butter, PA   Critical care provider statement:    Critical care time (minutes):  34   Critical care was necessary to treat or prevent imminent or life-threatening deterioration of the following conditions: anaphylaxis.   Critical care was time spent personally by me on the following activities:  Development of treatment plan with patient or surrogate, discussions with consultants, evaluation of patient's response to treatment, examination of patient, ordering and review of laboratory studies, ordering and review of radiographic studies, ordering and performing treatments and interventions, pulse oximetry, re-evaluation of patient's condition and review of old charts   I assumed direction of critical care for this patient from another provider in my specialty: no       Medications Ordered in ED Medications  Racepinephrine HCl 2.25 % nebulizer solution (0.5  mLs  Given 03/08/24 1402)  methylPREDNISolone sodium succinate (SOLU-MEDROL) 125 mg/2 mL injection 125 mg (125 mg Intravenous Given 03/08/24 1432)  famotidine  (PEPCID ) IVPB 20 mg premix (0 mg Intravenous Stopped 03/08/24 1530)  EPINEPHrine  (EPI-PEN) injection 0.3 mg (0.3 mg Intramuscular Given 03/08/24 1437)  ipratropium-albuterol  (DUONEB) 0.5-2.5 (3) MG/3ML nebulizer solution 3 mL (3 mLs Nebulization Given 03/08/24 1444)    ED Course/ Medical Decision Making/ A&P Clinical Course as of 03/08/24 1711  Sun Mar 08, 2024  1412 Patient initially assessed at bedside.  Faint auscultatory wheeze.  Faint auscultatory stridor.  Given findings, administration of racemic epi as well as epi IM was administered along with Solu-Medrol, Pepcid . [CR]  1440 Reassessment of the  patient showed improvement of symptoms.  Patient states that she is sleepy, still with some feelings of chest tightness but states that her symptoms are improving. [CR]  1512 Reassessment the patient again showed continued improvement.  Patient states that she still feels mild chest tightness but feelings of throat tightness have resolved.  Will continue to observe for the next hour and a half or so. [CR]    Clinical Course User Index [CR] Bryce Butter, PA                                 Medical Decision Making Risk Prescription drug management.   This patient presents to the ED for concern of allergic reaction, this involves an extensive number of treatment options, and is a complaint that carries with it a high risk of complications and morbidity.  The differential diagnosis includes anaphylaxis, angioedema, other   Co morbidities that complicate the patient evaluation  See HPI   Additional history obtained:  Additional history obtained from EMR External records from outside source obtained and reviewed including hospital records   Lab Tests:  N/a   Imaging Studies ordered:  N/a   Cardiac Monitoring: / EKG:  The  patient was maintained on a cardiac monitor.  I personally viewed and interpreted the cardiac monitored which showed an underlying rhythm of: Sinus rhythm   Consultations Obtained:  N/a   Problem List / ED Course / Critical interventions / Medication management  Allergic reaction I ordered medication including epinephrine , racemic epi, DuoNeb, Pepcid , Solu-Medrol   Reevaluation of the patient after these medicines showed that the patient improved I have reviewed the patients home medicines and have made adjustments as needed   Social Determinants of Health:  Denies tobacco, licit drug use.   Test / Admission - Considered:  Allergic reaction Vitals signs within normal range and stable throughout visit. 23 year old female presents emergency department with concerns for allergic reaction.  Patient reports eating a brownie with Oreo and at after working out at the gym.  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  Began to have feelings of chest tightness, throat itchiness and feelings as if her throat was tight.  States that she has had anaphylactic reaction before with use of EpiPen  in the past.  States that her EpiPen 's at home are out of date she did not take them.  Does state that she took 325 mg tablets of Benadryl prior to arrival.  Denies any rash, abdominal pain, nausea, vomiting.  States that her symptoms are not improved after the Benadryl. On exam, patient with faint auscultatory wheezing, faint auscultatory stridor appreciated.  No abdominal tenderness.  No obvious rash.  Patient received initial dose of epinephrine , Solu-Medrol, Pepcid , racemic epi.  Was monitored in the ED for 3 and half hours and did note gradual improvement reaching resolution of symptoms prior to discharge.  Suspect the patient's symptoms likely secondary to allergic reaction.  Will send home with refill of EpiPen , recommend use of antihistamine as needed.  Recommend close follow-up with  PCP for reevaluation of symptoms.  Treatment plan discussed with patient and she acknowledged understanding was agreeable to said plan.  Patient overall well-appearing, afebrile in no acute distress. Worrisome signs and symptoms were discussed with the patient, and the patient acknowledged understanding to return to the ED if noticed. Patient was stable upon discharge.  Final Clinical Impression(s) / ED Diagnoses Final diagnoses:  Allergic reaction, initial encounter    Rx / DC Orders ED Discharge Orders     None         Wolford Butter, [place] 03/08/24 1711    Nicklas Barns, MD 03/10/24 [phone]

## 2024-03-08 NOTE — Discharge Instructions (Addendum)
 As discussed, I suspect that your symptoms are most likely from an allergic reaction.  Will send you home with EpiPen  to use as needed for any symptoms concerning for anaphylaxis.  Will place you on prednisone  for a few days to prevent recurrence of reaction.  You may continue to take Benadryl as needed.  Please do not hesitate to return if the worrisome signs and symptoms we discussed become apparent.

## 2024-03-08 NOTE — ED Triage Notes (Signed)
 Pt arrived from home with allergic reaction. Ate an oreo brownie after gym. Allergic to eggs, small print with egg ingredient. Tightness in chest and throat. EPI pen expired.
